# Patient Record
Sex: Female | Born: 1950 | Race: White | Hispanic: No | Marital: Married | Smoking: Never smoker
Health system: Southern US, Community
[De-identification: ages and names within clinical notes are randomized; demographics above are authoritative.]

## PROBLEM LIST (undated history)

## (undated) DIAGNOSIS — IMO0002 Reserved for concepts with insufficient information to code with codable children: Secondary | ICD-10-CM

## (undated) DIAGNOSIS — R011 Cardiac murmur, unspecified: Secondary | ICD-10-CM

## (undated) DIAGNOSIS — M1990 Unspecified osteoarthritis, unspecified site: Secondary | ICD-10-CM

## (undated) DIAGNOSIS — M519 Unspecified thoracic, thoracolumbar and lumbosacral intervertebral disc disorder: Secondary | ICD-10-CM

## (undated) DIAGNOSIS — Z87448 Personal history of other diseases of urinary system: Secondary | ICD-10-CM

## (undated) HISTORY — DX: Unspecified thoracic, thoracolumbar and lumbosacral intervertebral disc disorder: M51.9

## (undated) HISTORY — DX: Unspecified osteoarthritis, unspecified site: M19.90

## (undated) HISTORY — DX: Reserved for concepts with insufficient information to code with codable children: IMO0002

## (undated) HISTORY — PX: BUNIONECTOMY: SHX129

## (undated) HISTORY — DX: Cardiac murmur, unspecified: R01.1

## (undated) HISTORY — DX: Personal history of other diseases of urinary system: Z87.448

---

## 1997-08-05 ENCOUNTER — Other Ambulatory Visit: Admission: RE | Admit: 1997-08-05 | Discharge: 1997-08-05 | Payer: Self-pay | Admitting: *Deleted

## 1997-09-18 ENCOUNTER — Ambulatory Visit (HOSPITAL_COMMUNITY): Admission: RE | Admit: 1997-09-18 | Discharge: 1997-09-18 | Payer: Self-pay | Admitting: Gastroenterology

## 1998-08-09 ENCOUNTER — Other Ambulatory Visit: Admission: RE | Admit: 1998-08-09 | Discharge: 1998-08-09 | Payer: Self-pay | Admitting: *Deleted

## 1999-05-03 ENCOUNTER — Encounter: Admission: RE | Admit: 1999-05-03 | Discharge: 1999-07-19 | Payer: Self-pay | Admitting: Family Medicine

## 2000-11-13 ENCOUNTER — Encounter: Payer: Self-pay | Admitting: Family Medicine

## 2000-11-13 ENCOUNTER — Encounter: Admission: RE | Admit: 2000-11-13 | Discharge: 2000-11-13 | Payer: Self-pay | Admitting: Family Medicine

## 2000-11-23 ENCOUNTER — Encounter: Payer: Self-pay | Admitting: Orthopaedic Surgery

## 2000-11-23 ENCOUNTER — Encounter: Admission: RE | Admit: 2000-11-23 | Discharge: 2000-11-23 | Payer: Self-pay | Admitting: Orthopaedic Surgery

## 2001-03-26 ENCOUNTER — Other Ambulatory Visit: Admission: RE | Admit: 2001-03-26 | Discharge: 2001-03-26 | Payer: Self-pay | Admitting: *Deleted

## 2001-08-26 ENCOUNTER — Encounter: Admission: RE | Admit: 2001-08-26 | Discharge: 2001-08-26 | Payer: Self-pay | Admitting: Orthopaedic Surgery

## 2001-08-26 ENCOUNTER — Encounter: Payer: Self-pay | Admitting: Orthopaedic Surgery

## 2002-12-04 ENCOUNTER — Other Ambulatory Visit: Admission: RE | Admit: 2002-12-04 | Discharge: 2002-12-04 | Payer: Self-pay | Admitting: *Deleted

## 2003-12-16 ENCOUNTER — Other Ambulatory Visit: Admission: RE | Admit: 2003-12-16 | Discharge: 2003-12-16 | Payer: Self-pay | Admitting: Obstetrics & Gynecology

## 2004-09-16 ENCOUNTER — Encounter: Admission: RE | Admit: 2004-09-16 | Discharge: 2004-09-16 | Payer: Self-pay | Admitting: Obstetrics & Gynecology

## 2005-02-14 ENCOUNTER — Other Ambulatory Visit: Admission: RE | Admit: 2005-02-14 | Discharge: 2005-02-14 | Payer: Self-pay | Admitting: Obstetrics and Gynecology

## 2005-06-15 ENCOUNTER — Encounter: Admission: RE | Admit: 2005-06-15 | Discharge: 2005-09-13 | Payer: Self-pay | Admitting: Orthopaedic Surgery

## 2006-04-10 LAB — HM COLONOSCOPY

## 2008-12-11 ENCOUNTER — Encounter: Admission: RE | Admit: 2008-12-11 | Discharge: 2008-12-11 | Payer: Self-pay | Admitting: Obstetrics and Gynecology

## 2010-05-01 ENCOUNTER — Encounter: Payer: Self-pay | Admitting: Obstetrics and Gynecology

## 2012-02-02 LAB — HM PAP SMEAR: HM Pap smear: NEGATIVE

## 2012-07-16 ENCOUNTER — Telehealth: Payer: Self-pay | Admitting: Obstetrics and Gynecology

## 2012-07-16 NOTE — Telephone Encounter (Signed)
[facility]/pt requests a new rx/[place]

## 2012-07-17 NOTE — Telephone Encounter (Signed)
Patients last aex was 10/13. Pt has aex scheduled for 10/14. Chart is on desk outside of your office for review

## 2012-07-17 NOTE — Telephone Encounter (Signed)
Joy - I don't see her chart.  I'm sorry.  Did I send this back to you?  If not, please find me her chart again and put it on my desk. Thanks.

## 2012-07-17 NOTE — Telephone Encounter (Signed)
Please bring me her paper chart.  And does she have an anex scheduled coming up?

## 2012-07-17 NOTE — Telephone Encounter (Signed)
I did not see where rx was given for this at aex 02-02-12 but it was given at aex 01-02-11. Please advise Routed to CR

## 2012-07-17 NOTE — Telephone Encounter (Signed)
I found her chart.  She wants premarin vag cream.  Ok for 1/2 g pv tiw until 10. 2013.

## 2012-07-18 ENCOUNTER — Other Ambulatory Visit: Payer: Self-pay | Admitting: *Deleted

## 2012-07-18 MED ORDER — ESTROGENS, CONJUGATED 0.625 MG/GM VA CREA: TOPICAL_CREAM | Freq: Every day | VAGINAL | Status: DC

## 2012-07-18 NOTE — Telephone Encounter (Signed)
rx sent to pharmacy by belinda p

## 2013-02-07 ENCOUNTER — Encounter: Payer: Self-pay | Admitting: Obstetrics and Gynecology

## 2013-02-07 ENCOUNTER — Ambulatory Visit: Payer: Self-pay | Admitting: Obstetrics and Gynecology

## 2013-04-17 ENCOUNTER — Encounter: Payer: Self-pay | Admitting: Obstetrics and Gynecology

## 2013-04-17 ENCOUNTER — Ambulatory Visit (INDEPENDENT_AMBULATORY_CARE_PROVIDER_SITE_OTHER): Payer: 59 | Admitting: Obstetrics and Gynecology

## 2013-04-17 VITALS — BP 123/71 | HR 77 | Resp 16 | Ht 64.0 in | Wt 140.0 lb

## 2013-04-17 DIAGNOSIS — Z01419 Encounter for gynecological examination (general) (routine) without abnormal findings: Secondary | ICD-10-CM

## 2013-04-17 DIAGNOSIS — N952 Postmenopausal atrophic vaginitis: Secondary | ICD-10-CM

## 2013-04-17 MED ORDER — ESTRADIOL 10 MCG VA TABS: 1.0000 | ORAL_TABLET | VAGINAL | Status: DC

## 2013-04-17 NOTE — Progress Notes (Signed)
Patient ID: Anna Dyer, female   DOB: 1950-06-27, 63 y.o.   MRN: [id number] GYNECOLOGY VISIT  PCP:    Catha Gosselin, MD  Referring provider:   HPI: 63 y.o.   Married  Caucasian  female   [phone] with Patient's last menstrual period was 04/10/2000.   here for   AEX.  Constipation.  Does better when eats yogurt. Notes some bladder urgency with tomatoes and coffee. No leak with cough, laugh, or sneeze. No prior medication use.   Hgb:    PCP Urine:  PCP  GYNECOLOGIC HISTORY: Patient's last menstrual period was 04/10/2000. Sexually active:  yes Partner preference: female Contraception:   postmenopausal Menopausal hormone therapy: none, using vagina estrogen cream DES exposure:  no  Blood transfusions:   no Sexually transmitted diseases:   no GYN Procedures:  none Mammogram:   03/2012 JYN:[facility]              Pap:   02-02-12 wnl:neg HR HPV History of abnormal pap smear:  no   OB History   Grav Para Term Preterm Abortions TAB SAB Ect Mult Living   4 4 4       4        LIFESTYLE: Exercise:    no           Tobacco:   no Alcohol:     no Drug use:  no  OTHER HEALTH MAINTENANCE: Tetanus/TDap:     2006 Gardisil:                n/a Influenza:             01/2013 Zostavax:              never  Bone density:      2009 osteopenia?(at former OB/GYN office) Colonoscopy:      2008 wnl [facility].  Next colonoscopy due 2018  Cholesterol check:  01/2013 borderline with PCP   Family History  Problem Relation Age of Onset  . Diabetes Mother   . Hypertension Mother   . Hyperlipidemia Mother   . Osteoarthritis Mother   . Diabetes Father   . Hypertension Father   . Heart attack Father     deceased  . Hyperlipidemia Father   . Osteopenia Sister   . Thyroid disease Sister   . Diabetes Brother     There are no active problems to display for this patient.  Past Medical History  Diagnosis Date  . History of hematuria     --negative w/u  . Dyspareunia     vaginal dryness  .  Osteoarthritis     --hips and neck    Past Surgical History  Procedure Laterality Date  . Bunionectomy Bilateral     ALLERGIES: Peanuts  Current Outpatient Prescriptions  Medication Sig Dispense Refill  . cholecalciferol (VITAMIN D) 1000 UNITS tablet Take 1,000 Units by mouth daily.      . Multiple Vitamin (MULTIVITAMIN) capsule Take 1 capsule by mouth daily.      . vitamin E 100 UNIT capsule Take 100 Units by mouth daily.      Marland Kitchen conjugated estrogens (PREMARIN) vaginal cream Place vaginally daily.  42.5 g  12   No current facility-administered medications for this visit.     ROS:  Pertinent items are noted in HPI.  SOCIAL HISTORY:  Media planner.    From [place].   4 children.    PHYSICAL EXAMINATION:    BP 123/71  Pulse 77  Resp 16  Ht 5\' 4"  (1.626 m)  Wt 140 lb (63.504 kg)  BMI 24.02 kg/m2  LMP 04/10/2000   Wt Readings from Last 3 Encounters:  04/17/13 140 lb (63.504 kg)     Ht Readings from Last 3 Encounters:  04/17/13 5\' 4"  (1.626 m)    General appearance: alert, cooperative and appears stated age Head: Normocephalic, without obvious abnormality, atraumatic Neck: no adenopathy, supple, symmetrical, trachea midline and thyroid not enlarged, symmetric, no tenderness/mass/nodules Lungs: clear to auscultation bilaterally Breasts: Inspection negative, No nipple retraction or dimpling, No nipple discharge or bleeding, No axillary or supraclavicular adenopathy, Normal to palpation without dominant masses Heart: regular rate and rhythm Abdomen: soft, non-tender; no masses,  no organomegaly Extremities: extremities normal, atraumatic, no cyanosis or edema Skin: Skin color, texture, turgor normal. No rashes or lesions Lymph nodes: Cervical, supraclavicular, and axillary nodes normal. No abnormal inguinal nodes palpated Neurologic: Grossly normal  Pelvic: External genitalia:  no lesions              Urethra:  normal appearing urethra with no  masses, tenderness or lesions              Bartholins and Skenes: normal                 Vagina: normal appearing vagina with normal color and discharge, no lesions.  Erythema and yellowish orange clear discharge.              Cervix: normal appearance              Pap and high risk HPV testing done: no.            Bimanual Exam:  Uterus:  uterus is normal size, shape, consistency and nontender                                      Adnexa: normal adnexa in size, nontender and no masses                                      Rectovaginal: Confirms                                      Anus:  normal sphincter tone, no lesions  ASSESSMENT  Normal gynecologic exam. Osteopenia Atrophic vaginitis.   PLAN  [facility] - hand written order. Pap smear and high risk HPV testing Counseled on Ca, Vit D, weight bearing exercise, Vagifem use including risks and benefits - thromboembolic events and breast cancer risk reviewed. [facility] density at [facility] - hand written order.  Return annually or prn   An After Visit Summary was printed and given to the patient.

## 2013-04-17 NOTE — Patient Instructions (Signed)
EXERCISE AND DIET:  We recommended that you start or continue a regular exercise program for good health. Regular exercise means any activity that makes your heart beat faster and makes you sweat.  We recommend exercising at least 30 minutes per day at least 3 days a week, preferably 4 or 5.  We also recommend a diet low in fat and sugar.  Inactivity, poor dietary choices and obesity can cause diabetes, heart attack, stroke, and kidney damage, among others.    ALCOHOL AND SMOKING:  Women should limit their alcohol intake to no more than 7 drinks/beers/glasses of wine (combined, not each!) per week. Moderation of alcohol intake to this level decreases your risk of breast cancer and liver damage. And of course, no recreational drugs are part of a healthy lifestyle.  And absolutely no smoking or even second hand smoke. Most people know smoking can cause heart and lung diseases, but did you know it also contributes to weakening of your bones? Aging of your skin?  Yellowing of your teeth and nails?  CALCIUM AND VITAMIN D:  Adequate intake of calcium and Vitamin D are recommended.  The recommendations for exact amounts of these supplements seem to change often, but generally speaking 600 mg of calcium (either carbonate or citrate) and 800 units of Vitamin D per day seems prudent. Certain women may benefit from higher intake of Vitamin D.  If you are among these women, your doctor will have told you during your visit.    PAP SMEARS:  Pap smears, to check for cervical cancer or precancers,  have traditionally been done yearly, although recent scientific advances have shown that most women can have pap smears less often.  However, every woman still should have a physical exam from her gynecologist every year. It will include a breast check, inspection of the vulva and vagina to check for abnormal growths or skin changes, a visual exam of the cervix, and then an exam to evaluate the size and shape of the uterus and  ovaries.  And after 61 years of age, a rectal exam is indicated to check for rectal cancers. We will also provide age appropriate advice regarding health maintenance, like when you should have certain vaccines, screening for sexually transmitted diseases, bone density testing, colonoscopy, mammograms, etc.   MAMMOGRAMS:  All women over 40 years old should have a yearly mammogram. Many facilities now offer a "3D" mammogram, which may cost around $50 extra out of pocket. If possible,  we recommend you accept the option to have the 3D mammogram performed.  It both reduces the number of women who will be called back for extra views which then turn out to be normal, and it is better than the routine mammogram at detecting truly abnormal areas.    COLONOSCOPY:  Colonoscopy to screen for colon cancer is recommended for all women at age 50.  We know, you hate the idea of the prep.  We agree, BUT, having colon cancer and not knowing it is worse!!  Colon cancer so often starts as a polyp that can be seen and removed at colonscopy, which can quite literally save your life!  And if your first colonoscopy is normal and you have no family history of colon cancer, most women don't have to have it again for 10 years.  Once every ten years, you can do something that may end up saving your life, right?  We will be happy to help you get it scheduled when you are ready.    Be sure to check your insurance coverage so you understand how much it will cost.  It may be covered as a preventative service at no cost, but you should check your particular policy.     Estradiol vaginal tablets What is this medicine? ESTRADIOL (es tra DYE ole) vaginal tablet is used to help relieve symptoms of vaginal irritation and dryness that occurs in some women during menopause. This medicine may be used for other purposes; ask your health care provider or pharmacist if you have questions. COMMON BRAND NAME(S): Vagifem What should I tell my health  care provider before I take this medicine? They need to know if you have any of these conditions: -abnormal vaginal bleeding -blood vessel disease or blood clots -breast, cervical, endometrial, ovarian, liver, or uterine cancer -dementia -diabetes -gallbladder disease -heart disease or recent heart attack -high blood pressure -high cholesterol -high level of calcium in the blood -hysterectomy -kidney disease -liver disease -migraine headaches -protein C deficiency -protein S deficiency -stroke -systemic lupus erythematosus (SLE) -tobacco smoker -an unusual or allergic reaction to estrogens, other hormones, medicines, foods, dyes, or preservatives -pregnant or trying to get pregnant -breast-feeding How should I use this medicine? This medicine is only for use in the vagina. Do not take by mouth. Wash your hands before and after use. Read package directions carefully. Unwrap the pre-filled applicator package. Lie on your back, part and bend your knees. Gently insert the applicator tip high in the vagina and push the plunger to release the tablet into the vagina. Gently remove the applicator. Throw away the applicator after use. Do not use your medicine more often than directed. Finish the full course prescribed by your doctor or health care professional even if you think your condition is better. Do not stop using except on the advice of your doctor or health care professional. Talk to your pediatrician regarding the use of this medicine in children. A patient package insert for the product will be given with each prescription and refill. Read this sheet carefully each time. The sheet may change frequently. Overdosage: If you think you have taken too much of this medicine contact a poison control center or emergency room at once. NOTE: This medicine is only for you. Do not share this medicine with others. What if I miss a dose? If you miss a dose, take it as soon as you can. If it is  almost time for your next dose, take only that dose. Do not take double or extra doses. What may interact with this medicine? Do not take this medicine with any of the following medications: -aromatase inhibitors like aminoglutethimide, anastrozole, exemestane, letrozole, testolactone This medicine may also interact with the following medications: -antibiotics used to treat tuberculosis like rifabutin, rifampin and rifapentene -raloxifene or tamoxifen -warfarin This list may not describe all possible interactions. Give your health care provider a list of all the medicines, herbs, non-prescription drugs, or dietary supplements you use. Also tell them if you smoke, drink alcohol, or use illegal drugs. Some items may interact with your medicine. What should I watch for while using this medicine? Visit your health care professional for regular checks on your progress. You will need a regular breast and pelvic exam. You should also discuss the need for regular mammograms with your health care professional, and follow his or her guidelines. This medicine can make your body retain fluid, making your fingers, hands, or ankles swell. Your blood pressure can go up. Contact your doctor or health care   professional if you feel you are retaining fluid. If you have any reason to think you are pregnant; stop taking this medicine at once and contact your doctor or health care professional. Tobacco smoking increases the risk of getting a blood clot or having a stroke, especially if you are more than 61 years old. You are strongly advised not to smoke. If you wear contact lenses and notice visual changes, or if the lenses begin to feel uncomfortable, consult your eye care specialist. If you are going to have elective surgery, you may need to stop taking this medicine beforehand. Consult your health care professional for advice prior to scheduling the surgery. What side effects may I notice from receiving this  medicine? Side effects that you should report to your doctor or health care professional as soon as possible: -allergic reactions like skin rash, itching or hives, swelling of the face, lips, or tongue -breast tissue changes or discharge -changes in vision -chest pain -confusion, trouble speaking or understanding -dark urine -general ill feeling or flu-like symptoms -light-colored stools -nausea, vomiting -pain, swelling, warmth in the leg -right upper belly pain -severe headaches -shortness of breath -sudden numbness or weakness of the face, arm or leg -trouble walking, dizziness, loss of balance or coordination -unusual vaginal bleeding -yellowing of the eyes or skin Side effects that usually do not require medical attention (report to your doctor or health care professional if they continue or are bothersome): -hair loss -increased hunger or thirst -increased urination -symptoms of vaginal infection like itching, irritation or unusual discharge -unusually weak or tired This list may not describe all possible side effects. Call your doctor for medical advice about side effects. You may report side effects to FDA at 1-800-FDA-1088. Where should I keep my medicine? Keep out of the reach of children. Store at room temperature between 15 and 30 degrees C (59 and 86 degrees F). Throw away any unused medicine after the expiration date. NOTE: This sheet is a summary. It may not cover all possible information. If you have questions about this medicine, talk to your doctor, pharmacist, or health care provider.  2014, Elsevier/Gold Standard. (2010-06-29 09:08:58)  

## 2013-10-08 ENCOUNTER — Telehealth: Payer: Self-pay

## 2013-10-08 NOTE — Telephone Encounter (Signed)
Left message to call Kaslyn Richburg at [phone].  Advise patient of mild osteopenia. Continue with calcium, vitamin d, and weight bearing exercise. Needs repeat study in 2 years.

## 2013-10-08 NOTE — Telephone Encounter (Signed)
Called pt with Dexa Scan results.  Explained mild Osteopenia and needs to be taking Calcium with Vitamin D daily and doing weight bearing exercises. Advised needs repeat scan in 2 years.  Patient voices understanding.

## 2013-10-30 NOTE — Telephone Encounter (Signed)
Returning a call to Kaitlyn. °

## 2013-10-30 NOTE — Telephone Encounter (Signed)
Left message to call Jowan Skillin at [phone]. 

## 2013-10-30 NOTE — Telephone Encounter (Signed)
Spoke with patient. Advised of BMD results as seen below. Patient is agreeable and verbalizes understanding. BMD results sent to scan.Patient states that taking calcium and vitamin d has been causing her constipation. Advised patient to drink plenty of fluids, eat foods high in fiber, try OTC stool softener. Patient is agreeable.  Routing to provider for final review. Patient agreeable to disposition. Will close encounter

## 2013-12-04 ENCOUNTER — Encounter: Payer: Self-pay | Admitting: Obstetrics and Gynecology

## 2014-02-09 ENCOUNTER — Encounter: Payer: Self-pay | Admitting: Obstetrics and Gynecology

## 2014-04-20 ENCOUNTER — Ambulatory Visit: Payer: 59 | Admitting: Obstetrics and Gynecology

## 2014-04-23 ENCOUNTER — Ambulatory Visit: Payer: 59 | Admitting: Obstetrics and Gynecology

## 2014-04-24 ENCOUNTER — Ambulatory Visit: Payer: 59 | Admitting: Obstetrics and Gynecology

## 2014-09-30 ENCOUNTER — Other Ambulatory Visit: Payer: Self-pay | Admitting: Neurosurgery

## 2014-10-26 ENCOUNTER — Other Ambulatory Visit (HOSPITAL_COMMUNITY): Payer: Self-pay

## 2014-10-30 ENCOUNTER — Ambulatory Visit (INDEPENDENT_AMBULATORY_CARE_PROVIDER_SITE_OTHER): Payer: BLUE CROSS/BLUE SHIELD | Admitting: Obstetrics and Gynecology

## 2014-10-30 ENCOUNTER — Encounter: Payer: Self-pay | Admitting: Obstetrics and Gynecology

## 2014-10-30 VITALS — BP 130/70 | HR 70 | Resp 14 | Ht 64.0 in | Wt 140.4 lb

## 2014-10-30 DIAGNOSIS — Z0000 Encounter for general adult medical examination without abnormal findings: Secondary | ICD-10-CM

## 2014-10-30 DIAGNOSIS — R3129 Other microscopic hematuria: Secondary | ICD-10-CM

## 2014-10-30 DIAGNOSIS — Z01419 Encounter for gynecological examination (general) (routine) without abnormal findings: Secondary | ICD-10-CM | POA: Diagnosis not present

## 2014-10-30 DIAGNOSIS — R312 Other microscopic hematuria: Secondary | ICD-10-CM

## 2014-10-30 DIAGNOSIS — R002 Palpitations: Secondary | ICD-10-CM | POA: Diagnosis not present

## 2014-10-30 DIAGNOSIS — R079 Chest pain, unspecified: Secondary | ICD-10-CM

## 2014-10-30 LAB — POCT URINALYSIS DIPSTICK
BILIRUBIN UA: NEGATIVE
Glucose, UA: NEGATIVE
KETONES UA: NEGATIVE
Leukocytes, UA: NEGATIVE
NITRITE UA: NEGATIVE
Protein, UA: NEGATIVE
UROBILINOGEN UA: NEGATIVE
pH, UA: 5

## 2014-10-30 NOTE — Patient Instructions (Signed)

## 2014-10-30 NOTE — Progress Notes (Signed)
Patient ID: Anna Dyer, female   DOB: March 26, 1951, 64 y.o.   MRN: [id number] 64 y.o. [id number] Married Caucasian female here for annual exam.    Constipation with calcium intake.   Some urgency and occasional stress incontinence.  Uses bladder irritants.   Pain with intercourse.  No bleeding.  Stopped Vagifem due to concerns about cancer.  Wants to try vaginal estrogen again.   Has some chest pain and palpitations.  Palpitations are long standing.  Pain in chest is more increased.  Saw Dr. Fraser Din 15 years ago for this and had normal evaluation. Wants to see a new cardiologist. Asking a referral.   Works in rehab.  Having problems with spinal stenosis.  Considering surgery.   PCP:  Catha Gosselin, MD  Patient's last menstrual period was 04/10/2000.          Sexually active: No. female The current method of family planning is vasectomy/post menopausal status.    Exercising: No.  none. Smoker:  no  Health Maintenance: Pap:  02-02-12 wnl:neg HR HPV History of abnormal Pap:  no MMG:  10-01-14 Density Cat:B/Neg:[facility] Colonoscopy:  2008 wnl [facility].  Next due 2018. BMD:   09-29-13   Result  Mild osteopenia--repeat 6yrs:[facility] TDaP:  Unsure ?2006(pt. Thinks had with employer) Screening Labs:  Hb today: PCP, Urine today: Trace RBCs--pt. States had neg. Urological work-up. No symptoms today.     reports that she has never smoked. She does not have any smokeless tobacco history on file. She reports that she does not drink alcohol or use illicit drugs.  Past Medical History  Diagnosis Date  . History of hematuria     --negative w/u  . Dyspareunia     vaginal dryness  . Osteoarthritis     --hips and neck  . Lumbar disc disease     Past Surgical History  Procedure Laterality Date  . Bunionectomy Bilateral     Current Outpatient Prescriptions  Medication Sig Dispense Refill  . cholecalciferol (VITAMIN D) 1000 UNITS tablet Take 1,000 Units by mouth daily.    . Multiple  Vitamin (MULTIVITAMIN) capsule Take 1 capsule by mouth daily.    . vitamin E 100 UNIT capsule Take 100 Units by mouth daily.     No current facility-administered medications for this visit.    Family History  Problem Relation Age of Onset  . Diabetes Mother   . Hypertension Mother   . Hyperlipidemia Mother   . Osteoarthritis Mother   . Diabetes Father   . Hypertension Father   . Heart attack Father     deceased  . Hyperlipidemia Father   . Osteopenia Sister   . Thyroid disease Sister   . Diabetes Brother     ROS:  Pertinent items are noted in HPI.  Otherwise, a comprehensive ROS was negative.  Exam:   BP 130/70 mmHg  Pulse 70  Resp 14  Ht 5\' 4"  (1.626 m)  Wt 140 lb 6.4 oz (63.685 kg)  BMI 24.09 kg/m2  LMP 04/10/2000    General appearance: alert, cooperative and appears stated age Head: Normocephalic, without obvious abnormality, atraumatic Neck: no adenopathy, supple, symmetrical, trachea midline and thyroid normal to inspection and palpation Lungs: clear to auscultation bilaterally Breasts: normal appearance, no masses or tenderness, Inspection negative, No nipple retraction or dimpling, No nipple discharge or bleeding, No axillary or supraclavicular adenopathy Heart: regular rate and rhythm Abdomen: soft, non-tender; bowel sounds normal; no masses,  no organomegaly Extremities: extremities normal, atraumatic, no cyanosis or  edema Skin: Skin color, texture, turgor normal. No rashes or lesions Lymph nodes: Cervical, supraclavicular, and axillary nodes normal. No abnormal inguinal nodes palpated Neurologic: Grossly normal  Pelvic: External genitalia:  no lesions              Urethra:  normal appearing urethra with no masses, tenderness or lesions              Bartholins and Skenes: normal                 Vagina: normal appearing vagina with normal color and discharge, no lesions.  Atrophy changes noted with small petechiae.              Cervix: no lesions               Pap taken: No. Bimanual Exam:  Uterus:  normal size, contour, position, consistency, mobility, non-tender              Adnexa: normal adnexa and no mass, fullness, tenderness              Rectovaginal: Yes.  .  Confirms.              Anus:  normal sphincter tone, no lesions  Chaperone was present for exam.  Assessment:   Well woman visit with normal exam. Mild osteopenia.  Atrophy of the vagina.  Chest pain and palpitations.  Microscopic hematuria with negative work up.   Plan: Yearly mammogram recommended after age 52.  Recommended self breast exam.  Pap and HR HPV as above. Discussed Calcium, Vitamin D, regular exercise program including cardiovascular and weight bearing exercise. Labs performed.  No..     Refills given on medications.  No..    Referral to cardiology. Discussed signs and symptoms of MI.  Call 911.   Can also see PCP for any cardiac concerns.  No estrogen treatment until cardiac work up completed.  Use H20 lubricants or cooking oils in the vagina.  Bone density in 2018.  Follow up annually and prn.    After visit summary provided.

## 2014-11-03 ENCOUNTER — Other Ambulatory Visit: Payer: Self-pay | Admitting: Neurosurgery

## 2014-11-04 ENCOUNTER — Encounter: Payer: Self-pay | Admitting: Cardiology

## 2014-11-04 ENCOUNTER — Ambulatory Visit (INDEPENDENT_AMBULATORY_CARE_PROVIDER_SITE_OTHER): Payer: BLUE CROSS/BLUE SHIELD | Admitting: Cardiology

## 2014-11-04 VITALS — BP 110/64 | HR 69 | Ht 64.0 in | Wt 140.1 lb

## 2014-11-04 DIAGNOSIS — M519 Unspecified thoracic, thoracolumbar and lumbosacral intervertebral disc disorder: Secondary | ICD-10-CM | POA: Diagnosis not present

## 2014-11-04 DIAGNOSIS — Z01810 Encounter for preprocedural cardiovascular examination: Secondary | ICD-10-CM | POA: Diagnosis not present

## 2014-11-04 DIAGNOSIS — R0789 Other chest pain: Secondary | ICD-10-CM

## 2014-11-04 DIAGNOSIS — R079 Chest pain, unspecified: Secondary | ICD-10-CM | POA: Insufficient documentation

## 2014-11-04 DIAGNOSIS — R072 Precordial pain: Secondary | ICD-10-CM | POA: Diagnosis not present

## 2014-11-04 NOTE — Assessment & Plan Note (Signed)
Patient has atypical chest pain. Electrocardiogram normal. Plan exercise treadmill preoperatively. If negative she may proceed with surgery. Note she does not have a murmur on examination.

## 2014-11-04 NOTE — Assessment & Plan Note (Signed)
Management per surgery.

## 2014-11-04 NOTE — Progress Notes (Signed)
     HPI: 64 year old female for preoperative evaluation prior to back surgery. Pt states she was seen by Dr Fraser Din 15 years ago for murmur; had stress test negative by her report. She has dyspnea with more extreme activities but not routine activities. No orthopnea, PND, pedal edema, syncope. She occasionally feels ache in her chest with stress or when she has not slept well. It lasts seconds and resolves. No radiation or associated symptoms. She is scheduled for back surgery and we were asked to evaluate preoperatively.  Current Outpatient Prescriptions  Medication Sig Dispense Refill  . cholecalciferol (VITAMIN D) 1000 UNITS tablet Take 1,000 Units by mouth daily.    . Multiple Vitamin (MULTIVITAMIN) capsule Take 1 capsule by mouth daily.    . vitamin E 100 UNIT capsule Take 100 Units by mouth daily.     No current facility-administered medications for this visit.    Allergies  Allergen Reactions  . Peanuts [Peanut Oil] Other (See Comments)    Makes patient feel "congested"     Past Medical History  Diagnosis Date  . History of hematuria     --negative w/u  . Dyspareunia     vaginal dryness  . Osteoarthritis     --hips and neck  . Lumbar disc disease   . Murmur     Past Surgical History  Procedure Laterality Date  . Bunionectomy Bilateral     History   Social History  . Marital Status: Married    Spouse Name: N/A  . Number of Children: 4  . Years of Education: N/A   Occupational History  . Not on file.   Social History Main Topics  . Smoking status: Never Smoker   . Smokeless tobacco: Not on file  . Alcohol Use: No  . Drug Use: No  . Sexual Activity:    Partners: Male    Birth Control/ Protection: Other-see comments     Comment: Vasectomy   Other Topics Concern  . Not on file   Social History Narrative    Family History  Problem Relation Age of Onset  . Diabetes Mother   . Hypertension Mother   . Hyperlipidemia Mother   . Osteoarthritis Mother    . Diabetes Father   . Hypertension Father   . Heart attack Father     Died of MI at age 53  . Hyperlipidemia Father   . Osteopenia Sister   . Thyroid disease Sister   . Diabetes Brother     ROS: numbness and weakness in lower extremities with ambulation but no fevers or chills, productive cough, hemoptysis, dysphasia, odynophagia, melena, hematochezia, dysuria, hematuria, rash, seizure activity, orthopnea, PND, pedal edema, claudication. Remaining systems are negative.  Physical Exam:   Blood pressure 110/64, pulse 69, height  (1.626 m), weight 140 lb 1.9 oz (63.558 kg), last menstrual period 04/10/2000.  General:  Well developed/well nourished in NAD Skin warm/dry Patient not depressed No peripheral clubbing Back-normal HEENT-normal/normal eyelids Neck supple/normal carotid upstroke bilaterally; no bruits; no JVD; no thyromegaly chest - CTA/ normal expansion CV - RRR/normal S1 and S2; no murmurs, rubs or gallops;  PMI nondisplaced Abdomen -NT/ND, no HSM, no mass, + bowel sounds, no bruit 2+ femoral pulses, no bruits Ext-no edema, chords, 2+ DP Neuro-grossly nonfocal  ECG sinus rhythm at a rate of 69. Occasional PAC. No ST changes. First-degree AV block.

## 2014-11-04 NOTE — Assessment & Plan Note (Signed)
Symptoms are atypical. Exercise treadmill for risk stratification.

## 2014-11-04 NOTE — Patient Instructions (Signed)
Your physician recommends that you schedule a follow-up appointment in: AS NEEDED  Your physician has requested that you have an exercise tolerance test. For further information please visit [email or web address]. Please also follow instruction sheet, as given.    Exercise Stress Electrocardiogram An exercise stress electrocardiogram is a test to check how blood flows to your heart. It is done to find areas of poor blood flow. You will need to walk on a treadmill for this test. The electrocardiogram will record your heartbeat when you are at rest and when you are exercising. BEFORE THE PROCEDURE  Do not have drinks with caffeine or foods with caffeine for 24 hours before the test, or as told by your doctor. This includes coffee, tea (even decaf tea), sodas, chocolate, and cocoa.  Follow your doctor's instructions about eating and drinking before the test.  Ask your doctor what medicines you should or should not take before the test. Take your medicines with water unless told by your doctor not to.  If you use an inhaler, bring it with you to the test.  Bring a snack to eat after the test.  Do not  smoke for 4 hours before the test.  Do not put lotions, powders, creams, or oils on your chest before the test.  Wear comfortable shoes and clothing. PROCEDURE  You will have patches put on your chest. Small areas of your chest may need to be shaved. Wires will be connected to the patches.  Your heart rate will be watched while you are resting and while you are exercising.  You will walk on the treadmill. The treadmill will slowly get faster to raise your heart rate.  The test will take about 1-2 hours. AFTER THE PROCEDURE  Your heart rate and blood pressure will be watched after the test.  You may return to your normal diet, activities, and medicines or as told by your doctor. Document Released: 09/13/2007 Document Revised: 08/11/2013 Document Reviewed: 12/02/2012 Exit[employer] Patient  Information 2015 [employer]. This information is not intended to replace advice given to you by your health care provider. Make sure you discuss any questions you have with your health care provider.   

## 2014-11-05 ENCOUNTER — Inpatient Hospital Stay (HOSPITAL_COMMUNITY): Admit: 2014-11-05 | Payer: Self-pay | Admitting: Neurosurgery

## 2014-11-05 ENCOUNTER — Ambulatory Visit (HOSPITAL_COMMUNITY)
Admission: RE | Admit: 2014-11-05 | Discharge: 2014-11-05 | Disposition: A | Discharge status: Home / Self Care | Payer: BLUE CROSS/BLUE SHIELD | Source: Ambulatory Visit | Attending: Internal Medicine | Admitting: Internal Medicine

## 2014-11-05 ENCOUNTER — Encounter (HOSPITAL_COMMUNITY): Payer: Self-pay

## 2014-11-05 DIAGNOSIS — R0789 Other chest pain: Secondary | ICD-10-CM | POA: Diagnosis not present

## 2014-11-05 LAB — EXERCISE TOLERANCE TEST
CHL CUP MPHR: 158 {beats}/min
CHL RATE OF PERCEIVED EXERTION: 15
CSEPEW: 7 METS
CSEPPHR: 176 {beats}/min
Exercise duration (min): 6 min
Percent HR: 111 %
Rest HR: 90 {beats}/min

## 2014-11-05 SURGERY — FOR MAXIMUM ACCESS (MAS) POSTERIOR LUMBAR INTERBODY FUSION (PLIF) 1 LEVEL
Anesthesia: General | Site: Back

## 2014-11-10 ENCOUNTER — Telehealth: Payer: Self-pay | Admitting: *Deleted

## 2014-11-10 NOTE — Telephone Encounter (Signed)
Pt needing clearance for L4-5 posterior lumbar fusion, GXT results with clearance faxed.

## 2014-12-03 ENCOUNTER — Inpatient Hospital Stay (HOSPITAL_COMMUNITY): Admit: 2014-12-03 | Payer: BLUE CROSS/BLUE SHIELD | Admitting: Neurosurgery

## 2014-12-03 ENCOUNTER — Encounter (HOSPITAL_COMMUNITY): Payer: Self-pay

## 2014-12-03 SURGERY — FOR MAXIMUM ACCESS (MAS) POSTERIOR LUMBAR INTERBODY FUSION (PLIF) 1 LEVEL
Anesthesia: General | Site: Back

## 2014-12-08 ENCOUNTER — Ambulatory Visit: Payer: Self-pay | Admitting: Cardiology

## 2015-02-16 ENCOUNTER — Telehealth: Payer: Self-pay | Admitting: Obstetrics and Gynecology

## 2015-02-16 NOTE — Telephone Encounter (Signed)
Patient wants to talk with the nurse. No information given °

## 2015-02-16 NOTE — Telephone Encounter (Signed)
Spoke with patient. Patient states that she recently had back surgery. Was told by the back surgeon that she needs "to be on a treatment plan for worsening osteopenia."  Patient states "I am unsure what I need to do. I am taking Calcium, but I do not know if that is enough and what he meant." Patient states that she recently had an MRI performed in September. Will have these results faxed to our office for further review. Patient's last BMD was performed on 09/29/2013.   Routing to Dr.Silva as FYI. Will await MRI results.

## 2015-02-16 NOTE — Telephone Encounter (Signed)
Her bone density in 2015 showed only mild osteopenia with low risk of fracture.  It may be helpful to get MRI AND records from her back surgeon to understand better what is happening with her care.

## 2015-02-17 NOTE — Telephone Encounter (Signed)
Results from the [facility] to Dr.Silva for review and advise.

## 2015-02-17 NOTE — Telephone Encounter (Signed)
Records received.  X-ray of lumbar spine shows osteopenia per report.   Patient had bone density in 2015 showing mild osteopenia.  We can try to have her do another bone density this year, but her insurance may no pay for this. Please place order for this if patient would like to do this sooner than June 2017, which would be the usual time.

## 2015-02-17 NOTE — Telephone Encounter (Signed)
Spoke with the patient. Patient states that she has signed a release for her MRI to be sent to the office. Had her surgery with Dr.Neagher at [facility]. Advised I will contact the [facility] to obtain recent MRI results along with the records from her back surgery. Patient is agreeable. Spoke with the [facility] who will fax over recent MRI and records from Dr.Neagher for Dr.Silva's review.

## 2015-02-18 NOTE — Telephone Encounter (Signed)
Spoke with patient. Advised of message as seen below from Dr.Silva. Patient is agreeable and verbalizes understanding. Patient would like to wait to have BMD in June 2017.  Routing to provider for final review. Patient agreeable to disposition. Will close encounter.

## 2015-02-19 ENCOUNTER — Other Ambulatory Visit: Payer: Self-pay | Admitting: Family Medicine

## 2015-02-19 DIAGNOSIS — M5440 Lumbago with sciatica, unspecified side: Secondary | ICD-10-CM

## 2015-03-17 ENCOUNTER — Other Ambulatory Visit: Payer: BLUE CROSS/BLUE SHIELD

## 2015-04-14 ENCOUNTER — Other Ambulatory Visit: Payer: BLUE CROSS/BLUE SHIELD

## 2015-04-15 ENCOUNTER — Ambulatory Visit
Admission: RE | Admit: 2015-04-15 | Discharge: 2015-04-15 | Disposition: A | Discharge status: Home / Self Care | Payer: BLUE CROSS/BLUE SHIELD | Source: Ambulatory Visit | Attending: Family Medicine | Admitting: Family Medicine

## 2015-04-15 DIAGNOSIS — M5440 Lumbago with sciatica, unspecified side: Secondary | ICD-10-CM

## 2015-04-26 ENCOUNTER — Other Ambulatory Visit: Payer: Self-pay | Admitting: Orthopedic Surgery

## 2015-04-26 ENCOUNTER — Ambulatory Visit
Admission: RE | Admit: 2015-04-26 | Discharge: 2015-04-26 | Disposition: A | Discharge status: Home / Self Care | Payer: BLUE CROSS/BLUE SHIELD | Source: Ambulatory Visit | Attending: Orthopedic Surgery | Admitting: Orthopedic Surgery

## 2015-04-26 DIAGNOSIS — M545 Low back pain, unspecified: Secondary | ICD-10-CM

## 2015-05-25 ENCOUNTER — Other Ambulatory Visit (HOSPITAL_COMMUNITY): Payer: Self-pay | Admitting: *Deleted

## 2015-05-25 DIAGNOSIS — N644 Mastodynia: Secondary | ICD-10-CM

## 2015-05-26 ENCOUNTER — Telehealth (HOSPITAL_COMMUNITY): Payer: Self-pay | Admitting: *Deleted

## 2015-05-26 NOTE — Telephone Encounter (Signed)
Telephoned patient at home # and confirmed [facility] appointment 

## 2015-05-27 ENCOUNTER — Ambulatory Visit (HOSPITAL_COMMUNITY)
Admission: RE | Admit: 2015-05-27 | Discharge: 2015-05-27 | Disposition: A | Discharge status: Home / Self Care | Payer: BLUE CROSS/BLUE SHIELD | Source: Ambulatory Visit | Attending: Obstetrics and Gynecology | Admitting: Obstetrics and Gynecology

## 2015-05-27 ENCOUNTER — Ambulatory Visit
Admission: RE | Admit: 2015-05-27 | Discharge: 2015-05-27 | Disposition: A | Discharge status: Home / Self Care | Payer: No Typology Code available for payment source | Source: Ambulatory Visit | Attending: Obstetrics and Gynecology | Admitting: Obstetrics and Gynecology

## 2015-05-27 ENCOUNTER — Encounter (HOSPITAL_COMMUNITY): Payer: Self-pay

## 2015-05-27 VITALS — BP 132/78 | Temp 97.8°F | Ht 65.0 in | Wt 143.0 lb

## 2015-05-27 DIAGNOSIS — N644 Mastodynia: Secondary | ICD-10-CM

## 2015-05-27 DIAGNOSIS — Z1239 Encounter for other screening for malignant neoplasm of breast: Secondary | ICD-10-CM

## 2015-05-27 NOTE — Progress Notes (Signed)
Complaints of bilateral breast soreness x 2 years that is worse when touches. Patient rates the pain at a 6 out of 10 when touches. Patient rates pain as mild when not touching.  Pap Smear: Pap smear not completed today. Last Pap smear was 01/23/2012 and normal with negative HPV. Per patient has no history of an abnormal Pap smear. Last Pap smear result is in EPIC.   Physical exam: Breasts Breasts symmetrical. No skin abnormalities bilateral breasts. No nipple retraction bilateral breasts. No nipple discharge bilateral breasts. No lymphadenopathy. No lumps palpated bilateral breasts. Complaints of bilateral breast tenderness at 6 o'clock. Referred patient to the [facility] for diagnostic mammogram. Appointment scheduled for Thursday, May 27, 2015 at 1530.   Pelvic/Bimanual No Pap smear completed today since last Pap smear and HPV typing was 01/23/2012. Pap smear not indicated per [facility] guidelines.   Smoking History: Patient has never smoked.  Patient Navigation: Patient education provided. Access to services provided for patient through [facility] program.   Colorectal Cancer Screening: Patient had a colonoscopy completed around 10 years ago. No complaints today. Patient will follow up with PCP on referral for colonoscopy.

## 2015-05-27 NOTE — Patient Instructions (Signed)
Educational materials on breast self awareness given. Explained to Anna Dyer that she did not need a Pap smear today due to last Pap smear was 01/23/2012. Let her know [facility] will cover Pap smears and HPV typing every 5 years unless has a history of abnormal Pap smears. Referred patient to the [facility] for diagnostic mammogram. Appointment scheduled for Thursday, May 27, 2015 at 1530. Patient aware of appointment and will be there.  Anna Dyer verbalized understanding.  Romario Tith, Kathaleen Maser, RN 1:58 PM

## 2015-05-28 ENCOUNTER — Encounter (HOSPITAL_COMMUNITY): Payer: Self-pay | Admitting: *Deleted

## 2015-11-19 ENCOUNTER — Ambulatory Visit: Payer: BLUE CROSS/BLUE SHIELD | Admitting: Obstetrics and Gynecology

## 2015-12-01 NOTE — Progress Notes (Deleted)
65 y.o. [id number] Married Caucasian female here for annual exam.    PCP:     Patient's last menstrual period was 04/10/2000.           Sexually active: {yes no:314532}  The current method of family planning is vasectomy/postmenopausal.    Exercising: {yes no:314532}  {types:19826} Smoker:  no  Health Maintenance: Pap:  02-02-12 Neg:Neg HR HPV History of abnormal Pap:  no MMG:  05-27-15 Diag.Bil.3D/Density B/Neg/BiRads1:[facility] Colonoscopy:  2008 normal with [facility];next 2018. BMD:  09-29-13  Result: Mild osteopenia:[facility]--repeat 1335yrs TDaP: ?2006  Gardasil:   N/A HIV: Hep C: Screening Labs:  Hb today: ***, Urine today: ***   reports that she has never smoked. She does not have any smokeless tobacco history on file. She reports that she does not drink alcohol or use drugs.  Past Medical History:  Diagnosis Date  . Dyspareunia    vaginal dryness  . History of hematuria    --negative w/u  . Lumbar disc disease   . Murmur   . Osteoarthritis    --hips and neck    Past Surgical History:  Procedure Laterality Date  . BUNIONECTOMY Bilateral     Current Outpatient Prescriptions  Medication Sig Dispense Refill  . cholecalciferol (VITAMIN D) 1000 UNITS tablet Take 1,000 Units by mouth daily.    . Multiple Vitamin (MULTIVITAMIN) capsule Take 1 capsule by mouth daily.    . vitamin E 100 UNIT capsule Take 100 Units by mouth daily.     No current facility-administered medications for this visit.     Family History  Problem Relation Age of Onset  . Diabetes Mother   . Hypertension Mother   . Hyperlipidemia Mother   . Osteoarthritis Mother   . Diabetes Father   . Hypertension Father   . Heart attack Father     Died of MI at age 63  . Hyperlipidemia Father   . Osteopenia Sister   . Thyroid disease Sister   . Diabetes Brother     ROS:  Pertinent items are noted in HPI.  Otherwise, a comprehensive ROS was negative.  Exam:   LMP 04/10/2000     General  appearance: alert, cooperative and appears stated age Head: Normocephalic, without obvious abnormality, atraumatic Neck: no adenopathy, supple, symmetrical, trachea midline and thyroid normal to inspection and palpation Lungs: clear to auscultation bilaterally Breasts: normal appearance, no masses or tenderness, No nipple retraction or dimpling, No nipple discharge or bleeding, No axillary or supraclavicular adenopathy Heart: regular rate and rhythm Abdomen: soft, non-tender; no masses, no organomegaly Extremities: extremities normal, atraumatic, no cyanosis or edema Skin: Skin color, texture, turgor normal. No rashes or lesions Lymph nodes: Cervical, supraclavicular, and axillary nodes normal. No abnormal inguinal nodes palpated Neurologic: Grossly normal  Pelvic: External genitalia:  no lesions              Urethra:  normal appearing urethra with no masses, tenderness or lesions              Bartholins and Skenes: normal                 Vagina: normal appearing vagina with normal color and discharge, no lesions              Cervix: no lesions              Pap taken: {yes no:314532} Bimanual Exam:  Uterus:  normal size, contour, position, consistency, mobility, non-tender  Adnexa: no mass, fullness, tenderness              Rectal exam: {yes no:314532}.  Confirms.              Anus:  normal sphincter tone, no lesions  Chaperone was present for exam.  Assessment:   Well woman visit with normal exam.   Plan: Yearly mammogram recommended after age 70.  Recommended self breast exam.  Pap and HR HPV as above. Discussed Calcium, Vitamin D, regular exercise program including cardiovascular and weight bearing exercise.   Follow up annually and prn.   Additional counseling given.  {yes [id number]. _______ minutes face to face time of which over 50% was spent in counseling.    After visit summary provided.

## 2015-12-02 ENCOUNTER — Encounter: Payer: Self-pay | Admitting: Obstetrics and Gynecology

## 2015-12-02 ENCOUNTER — Ambulatory Visit: Payer: BLUE CROSS/BLUE SHIELD | Admitting: Obstetrics and Gynecology

## 2017-04-30 IMAGING — MR MR LUMBAR SPINE W/O CM
4 of 5 series · 19 of 48 positions shown · non-contrast
Comparison: Lumbar spine radiographs 09/30/2014. Preoperative
lumbar spine MRI 03/12/2014.

CLINICAL DATA: Low back pain. Previous surgery 01/04/2015. Numbness
and weakness in the BILATERAL feet and legs.

EXAM:
MRI LUMBAR SPINE WITHOUT CONTRAST
TECHNIQUE: Multiplanar, multisequence MR imaging of the lumbar spine was
performed. No intravenous contrast was administered.

[Series 6: T2 · sagittal · 4.0mm · 0.73mm/px · 7 of 15 slices shown (1 of 2)]
[im 1/15]
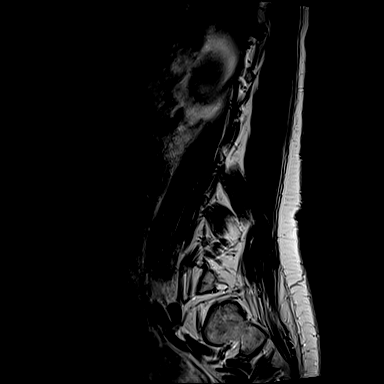
[im 3/15]
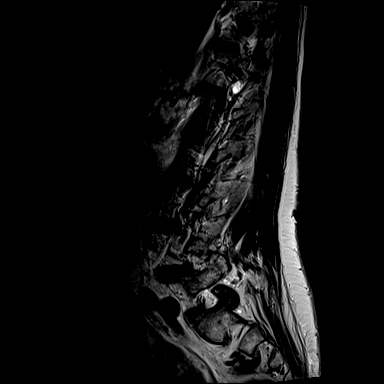
[im 5/15]
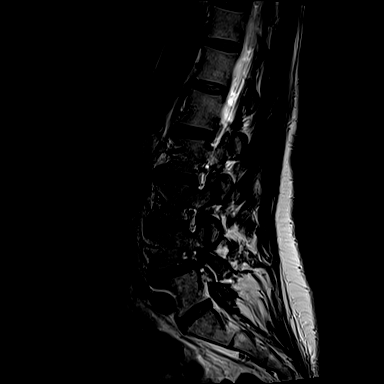
[im 8/15]
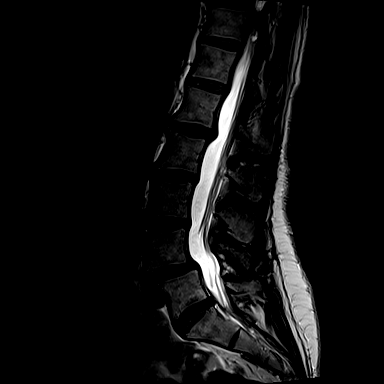
[im 10/15]
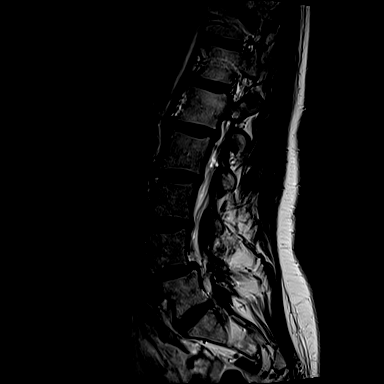
[im 12/15]
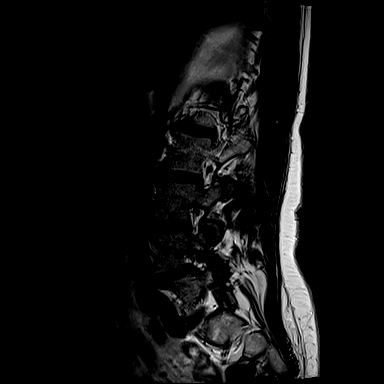
[im 15/15]
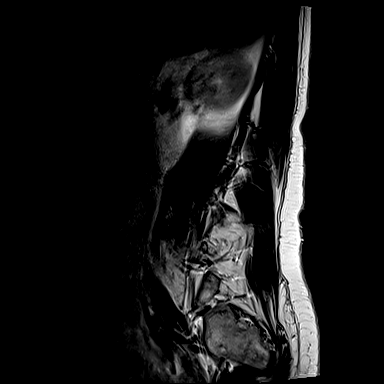

[Series 8: T1 · sagittal · 4.0mm · 0.73mm/px · 3 of 15 slices shown (1 of 2)]
[im 3/15]
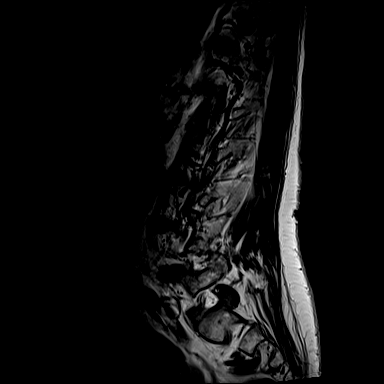
[im 9/15]
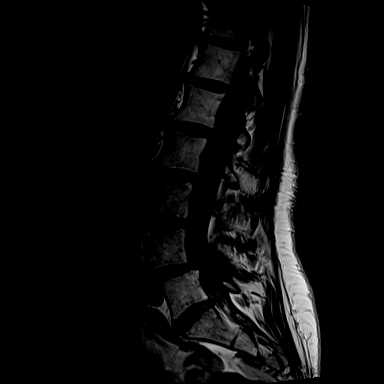
[im 15/15]
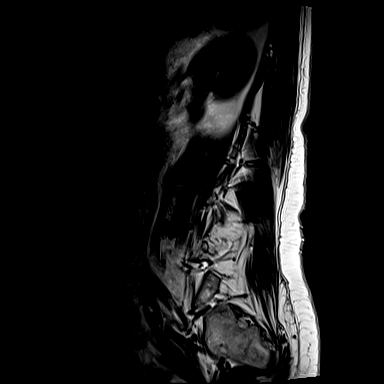

[Series 11: T1 · axial · 4.0mm · 0.28mm/px · z∈[-146,-14]mm · 3 of 33 slices shown (2 of 2)]
[im 5/33]
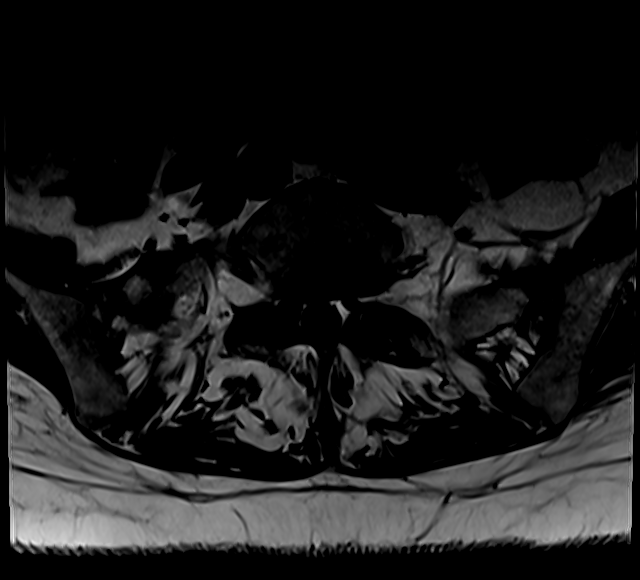
[im 18/33]
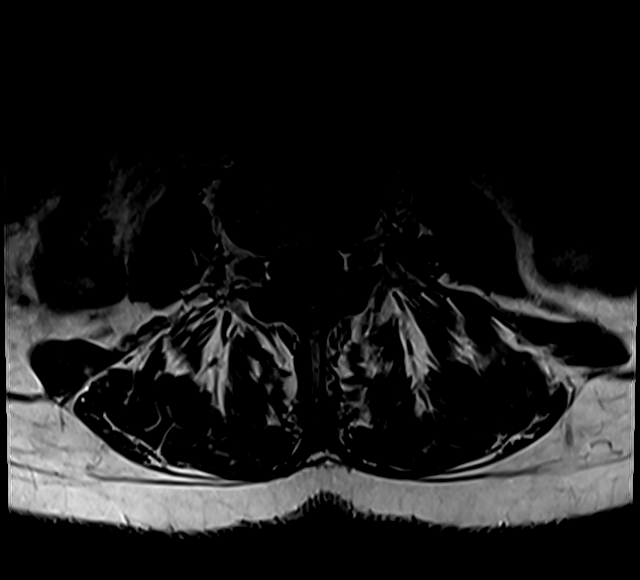
[im 28/33]
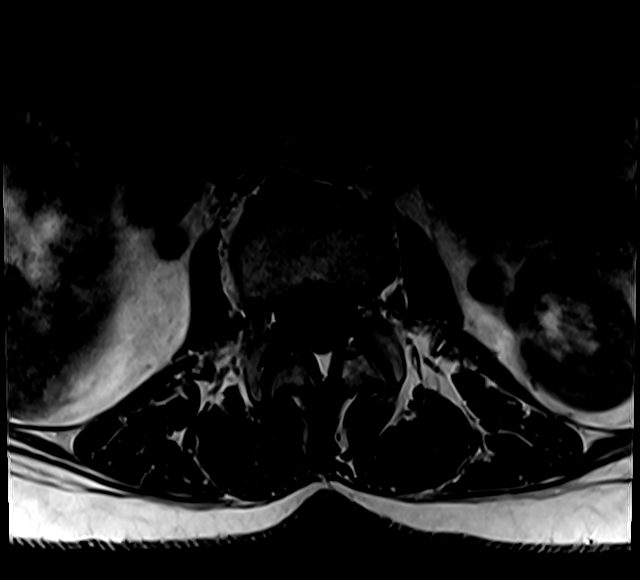

[Series 14: T2 · axial · 4.0mm · 0.28mm/px · z∈[-166,-14]mm · 6 of 33 slices shown (2 of 2)]
[im 1/33]
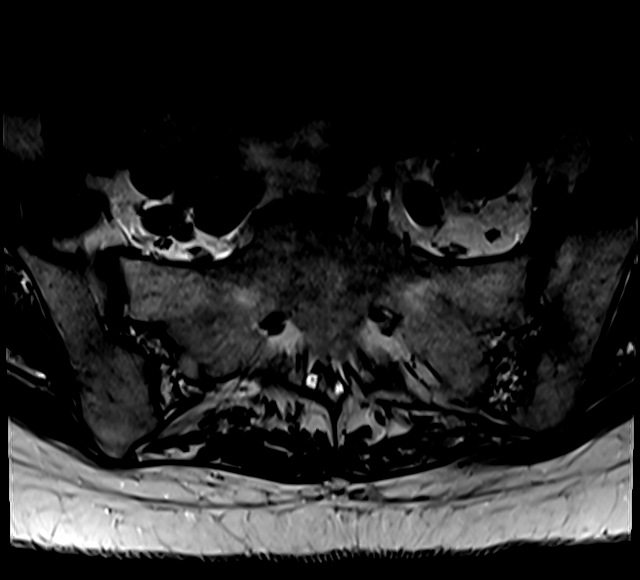
[im 5/33]
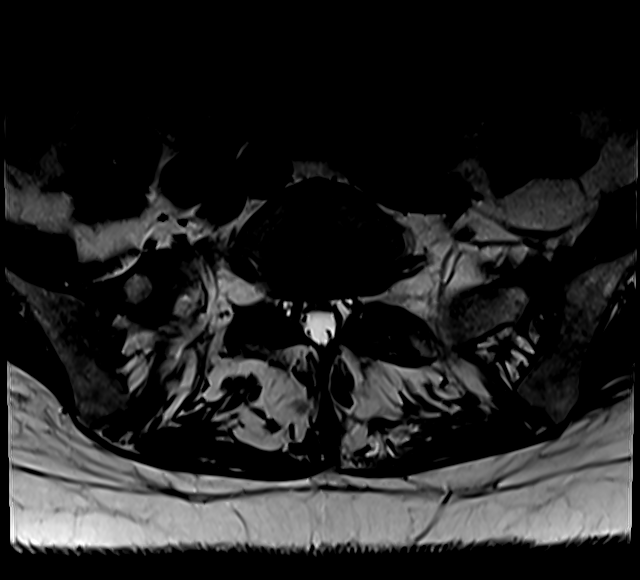
[im 10/33]
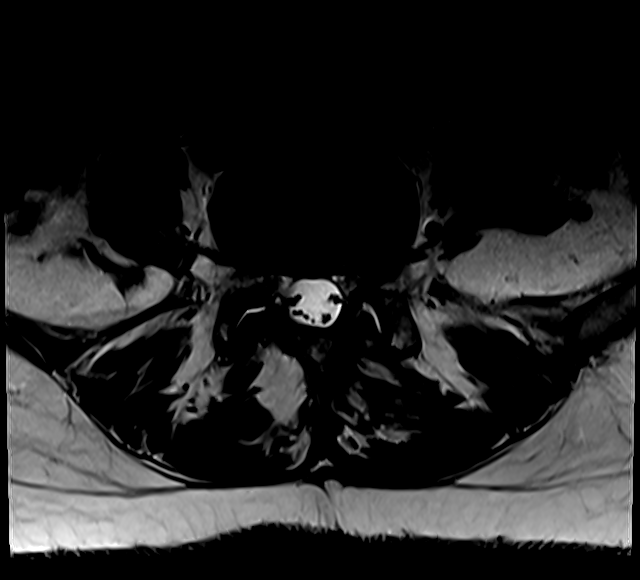
[im 15/33]
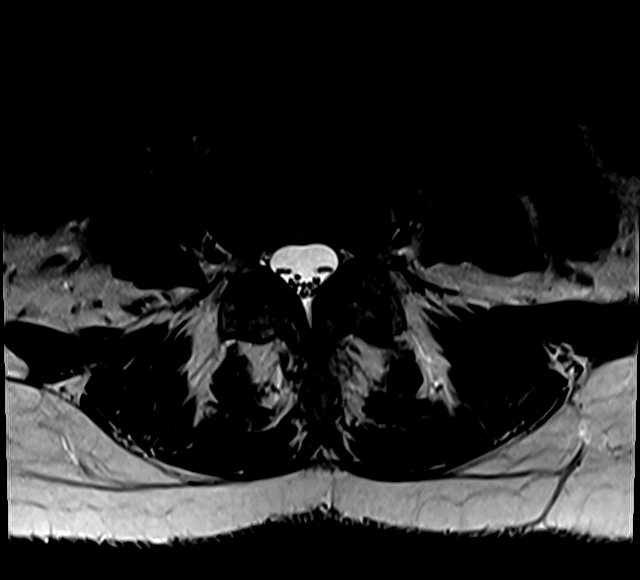
[im 18/33]
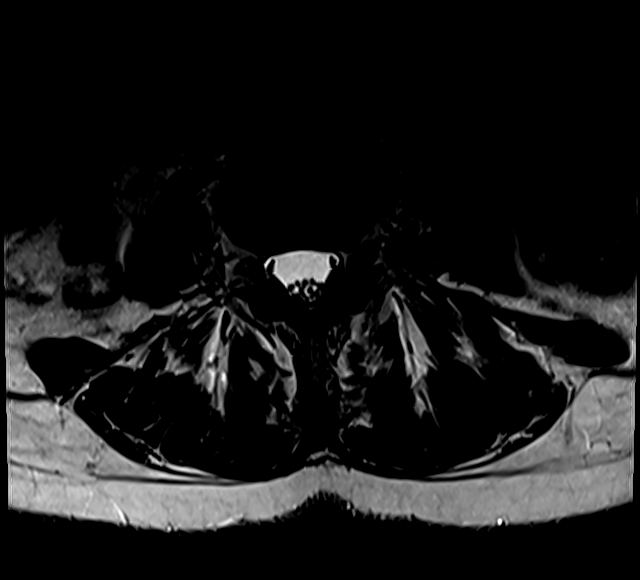
[im 28/33]
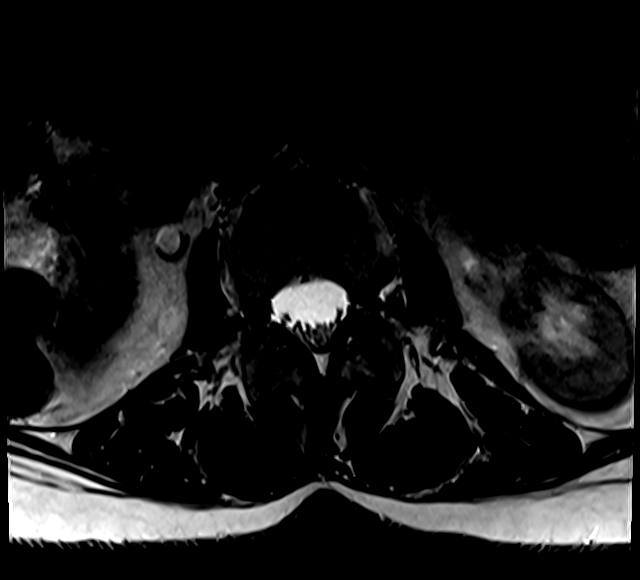

[19 of 48 positions shown; findings below may reference images not displayed]

FINDINGS: Segmentation: Normal.

Alignment: Anterolisthesis at L4-5 now measures 6 mm, compared with
4 mm previously. Patient has undergone BILATERAL laminotomies since
the previous MR.

Vertebrae: No worrisome osseous lesion.

Conus medullaris: Normal in size, signal, and location.

Paraspinal tissues: No evidence for hydronephrosis or paravertebral
mass.

Disc levels:

L1-L2:  Central and rightward protrusion.  No impingement.

L2-L3:  Mild bulge.  Mild facet arthropathy.  No impingement.

L3-L4:  Mild bulge.  Mild facet arthropathy.  No impingement.

L4-L5: 6 mm anterolisthesis. BILATERAL laminotomies have been
performed. The overall stenosis is improved compared with
preoperative appearance. There is a new extradural T2 hyperintense
ovoid structure, with mass effect on the RIGHT side of the thecal
sac and RIGHT L5 and S1 nerve roots measuring 8 x 8 x 16 mm. The
lesion appears to lie beneath the axilla of the RIGHT L5 nerve root
sleeve. I suspect it represents a postsurgical pseudomeningocele,
possibly from a dural tear. It does not represent a synovial cyst or
nerve sheath tumor. This lesion was not present previously.

L5-S1:  Mild bulge.  Mild facet arthropathy.  No impingement.
IMPRESSION: Improved stenosis at L4-5 status post BILATERAL laminotomies.

Further anterolisthesis has developed at L4-5, now 6 mm as compared
to 4 mm previously. Please note that dynamic instability could be
present when the patient stands, increasing the anterolisthesis to
an even greater degree. Standing lateral flexion extension lumbar
radiographs are recommended for further evaluation.

There is an abnormal 8 x 8 x 16 mm T2 hyperintense ovoid structure,
extradural, at L4-5, lying beneath the axilla of the exiting RIGHT
L5 nerve root. I suspect this represents a postoperative
pseudomeningocele from a dural tear.

Post infusion imaging of the lumbar spine could provide additional
information although lumbar myelography may ultimately be needed to
better characterize the location as well as the presence or absence
of communication with the subarachnoid space.
